# Patient Record
Sex: Male | Born: 1990 | Race: White | Hispanic: No | Marital: Single | Smoking: Never smoker
Health system: Southern US, Community
[De-identification: ages and names within clinical notes are randomized; demographics above are authoritative.]

---

## 2016-12-30 ENCOUNTER — Encounter (HOSPITAL_COMMUNITY): Payer: Self-pay | Admitting: *Deleted

## 2016-12-30 ENCOUNTER — Emergency Department (HOSPITAL_COMMUNITY)
Admission: EM | Admit: 2016-12-30 | Discharge: 2016-12-31 | Discharge status: Against Medical Advice | Payer: BLUE CROSS/BLUE SHIELD | Attending: Family Medicine | Admitting: Family Medicine

## 2016-12-30 DIAGNOSIS — N200 Calculus of kidney: Secondary | ICD-10-CM | POA: Diagnosis not present

## 2016-12-30 DIAGNOSIS — R109 Unspecified abdominal pain: Secondary | ICD-10-CM | POA: Diagnosis present

## 2016-12-30 LAB — URINALYSIS, ROUTINE W REFLEX MICROSCOPIC
Bacteria, UA: NONE SEEN
Bilirubin Urine: NEGATIVE
GLUCOSE, UA: NEGATIVE mg/dL
KETONES UR: NEGATIVE mg/dL
Nitrite: NEGATIVE
PH: 5 (ref 5.0–8.0)
Protein, ur: NEGATIVE mg/dL
SPECIFIC GRAVITY, URINE: 1.014 (ref 1.005–1.030)
Squamous Epithelial / LPF: NONE SEEN

## 2016-12-30 LAB — CBC
HCT: 42.8 % (ref 39.0–52.0)
Hemoglobin: 14.7 g/dL (ref 13.0–17.0)
MCH: 29.3 pg (ref 26.0–34.0)
MCHC: 34.3 g/dL (ref 30.0–36.0)
MCV: 85.4 fL (ref 78.0–100.0)
PLATELETS: 331 10*3/uL (ref 150–400)
RBC: 5.01 MIL/uL (ref 4.22–5.81)
RDW: 12.9 % (ref 11.5–15.5)
WBC: 12.8 10*3/uL — AB (ref 4.0–10.5)

## 2016-12-30 LAB — COMPREHENSIVE METABOLIC PANEL
ALT: 18 U/L (ref 17–63)
AST: 27 U/L (ref 15–41)
Albumin: 4.1 g/dL (ref 3.5–5.0)
Alkaline Phosphatase: 52 U/L (ref 38–126)
Anion gap: 8 (ref 5–15)
BUN: 6 mg/dL (ref 6–20)
CALCIUM: 9.2 mg/dL (ref 8.9–10.3)
CO2: 24 mmol/L (ref 22–32)
Chloride: 106 mmol/L (ref 101–111)
Creatinine, Ser: 1.23 mg/dL (ref 0.61–1.24)
Glucose, Bld: 151 mg/dL — ABNORMAL HIGH (ref 65–99)
Potassium: 3.4 mmol/L — ABNORMAL LOW (ref 3.5–5.1)
SODIUM: 138 mmol/L (ref 135–145)
Total Bilirubin: 1.7 mg/dL — ABNORMAL HIGH (ref 0.3–1.2)
Total Protein: 7.5 g/dL (ref 6.5–8.1)

## 2016-12-30 LAB — LIPASE, BLOOD: LIPASE: 27 U/L (ref 11–51)

## 2016-12-30 NOTE — ED Triage Notes (Signed)
Pt reports acute onset L lower abd pain onset this am that resolved, pt reports pain returning 1-2 hrs ago, pt denies n/v/d, pts mom states, "he does a lot of heavy lifting at home." pt A&O x4, pt tachycardic in triage

## 2016-12-30 NOTE — ED Provider Notes (Signed)
[facility]-EMERGENCY DEPT Provider Note   CSN: [id number] Arrival date & time: 12/30/16  1655     History   Chief Complaint Chief Complaint  Patient presents with  . Abdominal Pain    HPI Bradley Higgins is a 26 y.o. male who presents to the emergency department with a chief complaint of intermittent, sharp left flank pain that began yesterday at 3 AM and woke him from sleep. He reports the pain lasted for a few minutes before resolving spontaneously. He reports another episode of pain that resolved 1-2 hours PTA after eating lunch that also resolved spontaneously. He denies nausea, vomiting, diarrhea, fever, chills. No aggravating or alleviating factors. No treatment prior to arrival. 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 He denies groin pain, testicular pain, or penile pain or discharge. He denies dysuria, frequency, or hematuria. He has no pain at this time.   No pertinent past medical history. No daily medications.  The history is provided by the patient. No language interpreter was used.    History reviewed. No pertinent past medical history.  There are no active problems to display for this patient.   History reviewed. No pertinent surgical history.   Home Medications    Prior to Admission medications   Medication Sig Start Date End Date Taking? Authorizing Provider  oxyCODONE-acetaminophen (PERCOCET/ROXICET) 5-325 MG tablet Take 1 tablet by mouth every 8 (eight) hours as needed for severe pain. 12/31/16   Casyn Becvar A, PA-C  tamsulosin (FLOMAX) 0.4 MG CAPS capsule Take 1 capsule (0.4 mg total) by mouth daily after breakfast. 12/31/16   Shanyla Marconi A, PA-C    Family History No family history on file.  Social History Social History  Substance Use Topics  . Smoking status: Never Smoker  . Smokeless tobacco: Never Used  . Alcohol use Not on file     Allergies   Patient has no known  allergies.   Review of Systems Review of Systems  Constitutional: Negative for activity change, chills and fever.  Respiratory: Negative for shortness of breath.   Cardiovascular: Negative for chest pain.  Gastrointestinal: Negative for abdominal pain, diarrhea, nausea and vomiting.  Genitourinary: Positive for flank pain. Negative for discharge, dysuria, penile pain, scrotal swelling and testicular pain.  Musculoskeletal: Negative for back pain.  Skin: Negative for rash.     Physical Exam Updated Vital Signs BP 106/66   Pulse 89   Temp 98.3 F (36.8 C) (Oral)   Resp 18   SpO2 97%   Physical Exam  Constitutional: He appears well-developed.  HENT:  Head: Normocephalic.  Eyes: Conjunctivae are normal.  Neck: Neck supple.  Cardiovascular: Normal rate, regular rhythm, normal heart sounds and intact distal pulses.   No murmur heard. Pulmonary/Chest: Effort normal and breath sounds normal. No respiratory distress. He has no wheezes. He has no rales.  Abdominal: Soft. He exhibits no distension and no mass. There is no tenderness. There is no rebound and no guarding.  No CVA tenderness bilaterally. No pain at this time.   Musculoskeletal:  No flank pain bilaterally. No tenderness to palpation of the cervical, thoracic, or lumbar spinous processes or surrounding paraspinal muscles.  Neurological: He is alert.  Skin: Skin is warm and dry.  Psychiatric: His behavior is normal.  Nursing note and vitals reviewed.  ED Treatments / Results  Labs (all labs ordered are listed, but only abnormal results are displayed) Labs  Reviewed  COMPREHENSIVE METABOLIC PANEL - Abnormal; Notable for the following:       Result Value   Potassium 3.4 (*)    Glucose, Bld 151 (*)    Total Bilirubin 1.7 (*)    All other components within normal limits  CBC - Abnormal; Notable for the following:    WBC 12.8 (*)    All other components within normal limits  URINALYSIS, ROUTINE W REFLEX MICROSCOPIC -  Abnormal; Notable for the following:    Hgb urine dipstick LARGE (*)    Leukocytes, UA TRACE (*)    All other components within normal limits  LIPASE, BLOOD    EKG  EKG Interpretation None       Radiology Ct Renal Stone Study  Result Date: 12/31/2016 CLINICAL DATA:  Sudden onset of left flank pain, onset at 04:00 yesterday. EXAM: CT ABDOMEN AND PELVIS WITHOUT CONTRAST TECHNIQUE: Multidetector CT imaging of the abdomen and pelvis was performed following the standard protocol without IV contrast. COMPARISON:  None. FINDINGS: Lower chest: No acute abnormality. Hepatobiliary: No focal liver abnormality is seen. No gallstones, gallbladder wall thickening, or biliary dilatation. Pancreas: Unremarkable. No pancreatic ductal dilatation or surrounding inflammatory changes. Spleen: Normal in size without focal abnormality. Adrenals/Urinary Tract: Both adrenals are normal. Right kidney and ureter are normal. Moderate left hydronephrosis due to a 2 mm calculus of the proximal left ureter at the L2-3 level. Urinary bladder is unremarkable. Stomach/Bowel: Stomach is within normal limits. Appendix is normal. No evidence of bowel wall thickening, distention, or inflammatory changes. Vascular/Lymphatic: No significant vascular findings are present. No enlarged abdominal or pelvic lymph nodes. Reproductive: Unremarkable Other: Small fat containing umbilical hernia. Musculoskeletal: No significant skeletal lesion. IMPRESSION: 2 mm left ureteral calculus at the L2-3 level with moderate hydronephrosis. Electronically Signed   By: Ellery Plunkaniel R Mitchell M.D.   On: 12/31/2016 00:43    Procedures Procedures (including critical care time)  Medications Ordered in ED Medications - No data to display   Initial Impression / Assessment and Plan / ED Course  I have reviewed the triage vital signs and the nursing notes.  Pertinent labs & imaging results that were available during my care of the patient were reviewed by me  and considered in my medical decision making (see chart for details).     Pt has been diagnosed with a Kidney Stone via CT. There is no evidence of significant hydronephrosis, serum creatine WNL, and the pt does not have irratractable vomiting. Patient was initially tachycardic, but improved throughout the visit. Pt will be dc home with Tamulosin, pain medication, & has been advised to follow up with PCP.   Final Clinical Impressions(s) / ED Diagnoses   Final diagnoses:  Kidney stone    New Prescriptions New Prescriptions   OXYCODONE-ACETAMINOPHEN (PERCOCET/ROXICET) 5-325 MG TABLET    Take 1 tablet by mouth every 8 (eight) hours as needed for severe pain.   TAMSULOSIN (FLOMAX) 0.4 MG CAPS CAPSULE    Take 1 capsule (0.4 mg total) by mouth daily after breakfast.     Lilian KapurMcDonald, Bryley Chrisman A, PA-C 12/31/16 0126    Mancel BaleWentz, Elliott, MD 01/09/17 2244

## 2016-12-31 ENCOUNTER — Emergency Department (HOSPITAL_COMMUNITY): Payer: BLUE CROSS/BLUE SHIELD

## 2016-12-31 MED ORDER — TAMSULOSIN HCL 0.4 MG PO CAPS: 0.4000 mg | ORAL_CAPSULE | Freq: Every day | ORAL | 0 refills | Status: AC

## 2016-12-31 MED ORDER — OXYCODONE-ACETAMINOPHEN 5-325 MG PO TABS: 1.0000 | ORAL_TABLET | Freq: Three times a day (TID) | ORAL | 0 refills | Status: AC | PRN

## 2016-12-31 NOTE — ED Notes (Signed)
Pt to CT via stretcher

## 2016-12-31 NOTE — Discharge Instructions (Signed)
Your were diagnosed with a kidney stone today. Please take tamsulosin once daily until your stone passes. You can call to follow up with urology when they reopen on Tuesday if your stone has not passed by then. You may take 1 tablet of Percocet every 8 hours as needed for severe pain. Please do not drive or work after taking this medication because it can make you sleepy.  It is also a narcotic medication and can be addicting so please only take when your pain is severe.   If you develop new or worsening symptoms, including pain that does not improve with pain medication, fever, chills, or vomiting, please return to the emergency department for re-evaluation.

## 2018-05-22 IMAGING — CT CT RENAL STONE PROTOCOL
2 of 4 series · 17 of 46 positions shown, 19 images · non-contrast
Comparison: None.

CLINICAL DATA: Sudden onset of left flank pain, onset at [DATE]
yesterday.

EXAM:
CT ABDOMEN AND PELVIS WITHOUT CONTRAST
TECHNIQUE: Multidetector CT imaging of the abdomen and pelvis was performed
following the standard protocol without IV contrast.

[Series 3: renal stone 5.0 · axial · 0.84mm/px · z∈[-400,+55]mm · 14 of 101 slices shown, 16 images]
[im 5/101  soft-tissue]
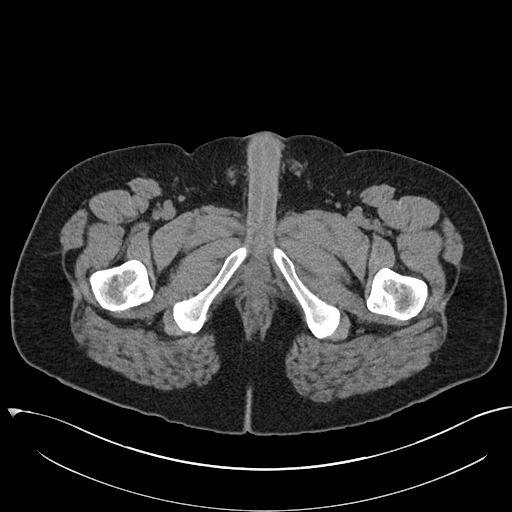
[im 5/101  bone]
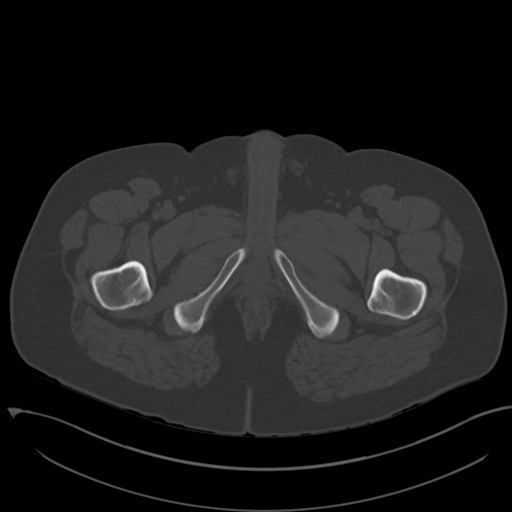
[im 14/101  soft-tissue]
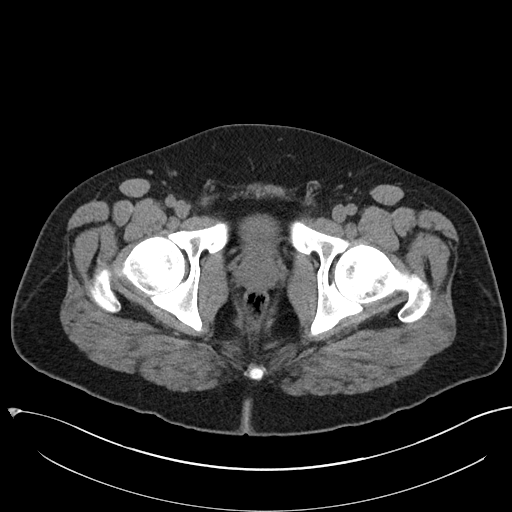
[im 18/101  soft-tissue]
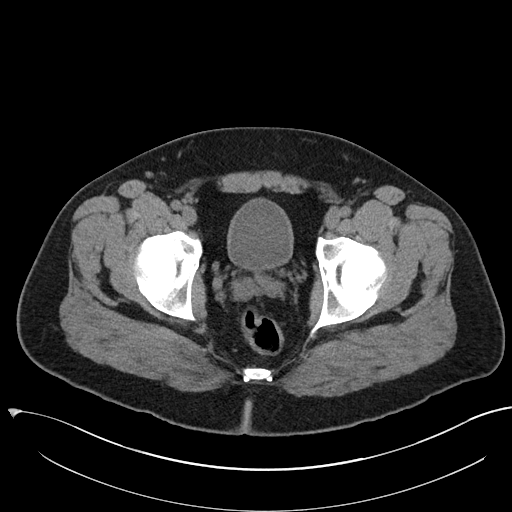
[im 27/101  soft-tissue]
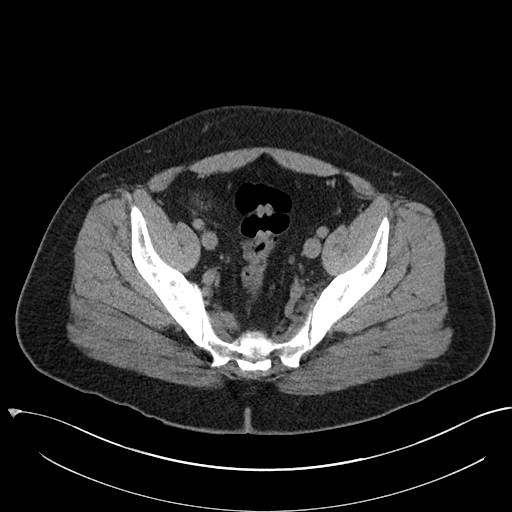
[im 35/101  soft-tissue]
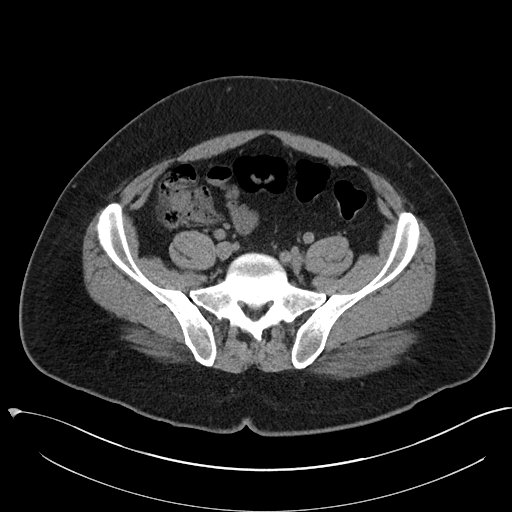
[im 40/101  soft-tissue]
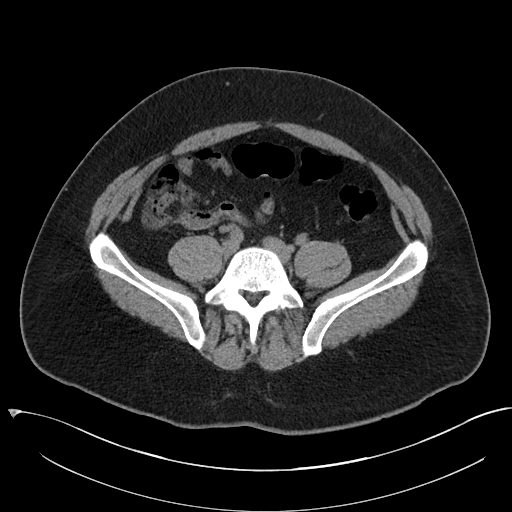
[im 48/101  soft-tissue]
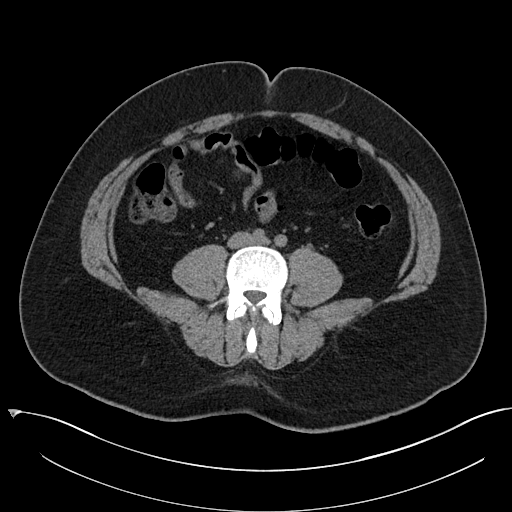
[im 53/101  soft-tissue]
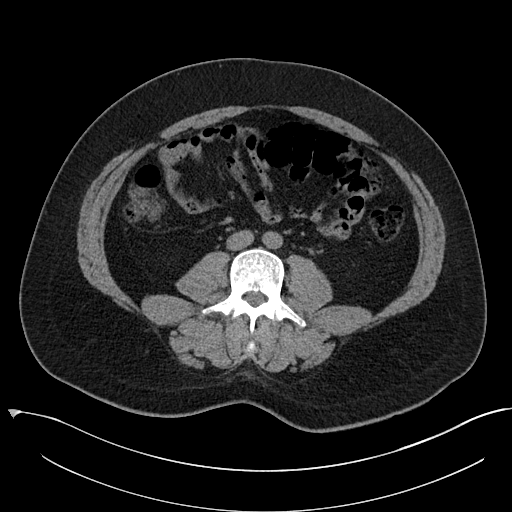
[im 61/101  soft-tissue]
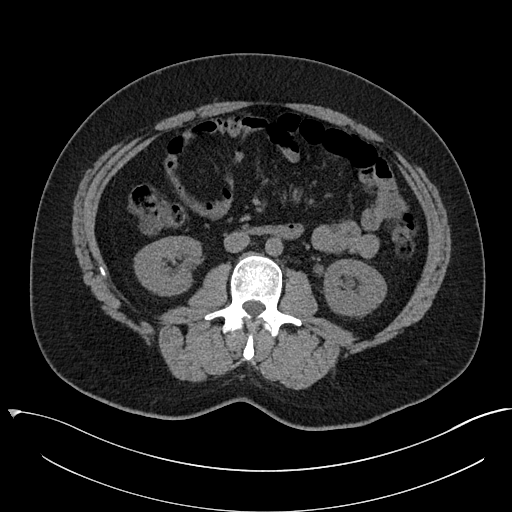
[im 61/101  bone]
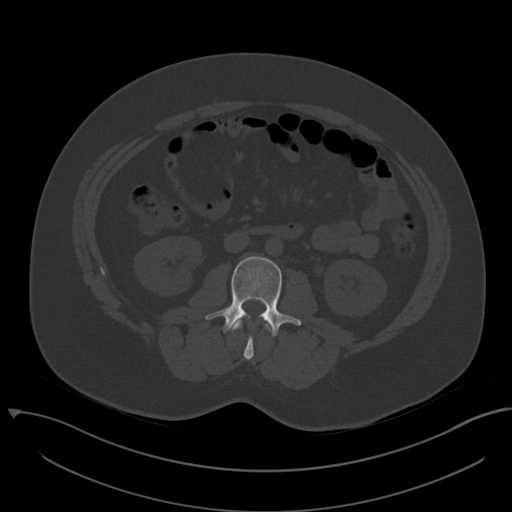
[im 66/101  soft-tissue]
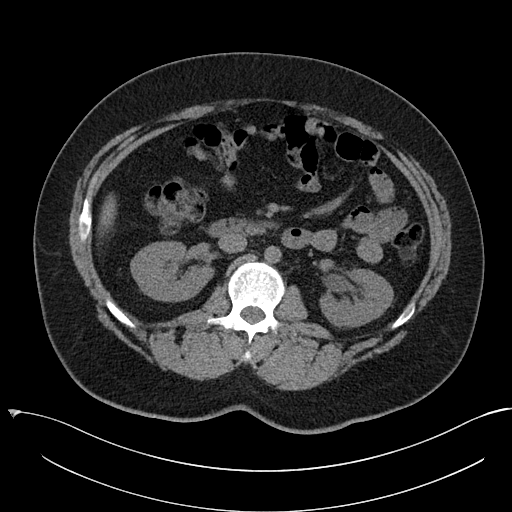
[im 74/101  soft-tissue]
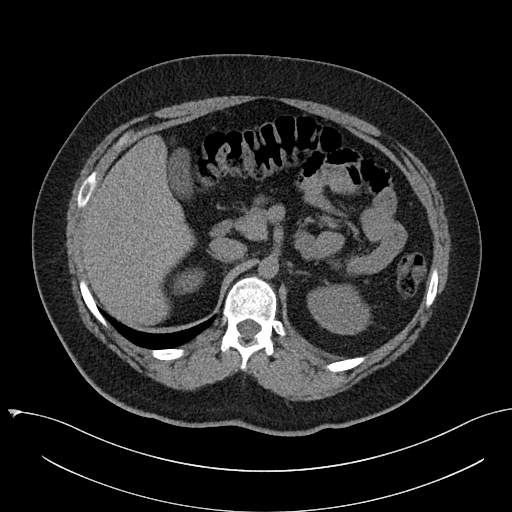
[im 83/101  soft-tissue]
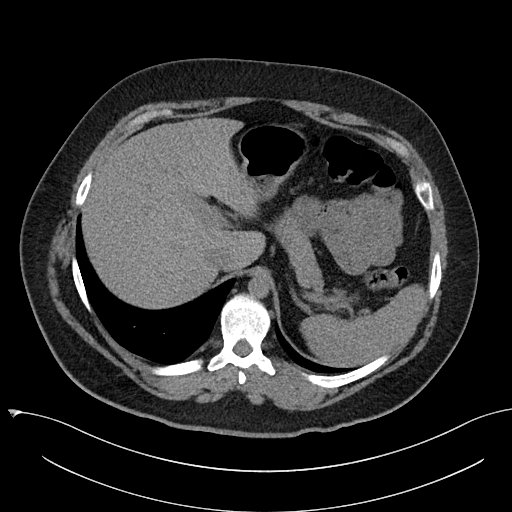
[im 87/101  soft-tissue]
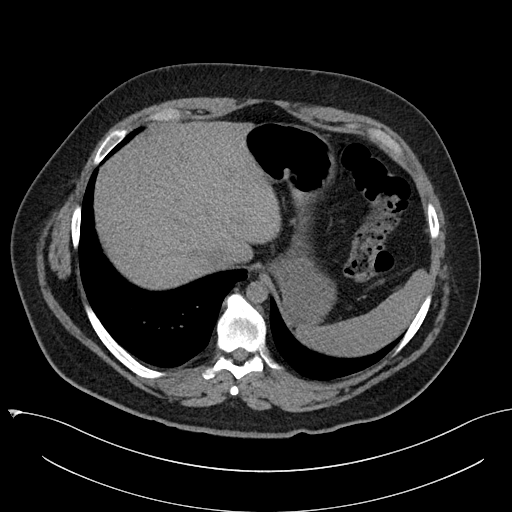
[im 96/101  soft-tissue]
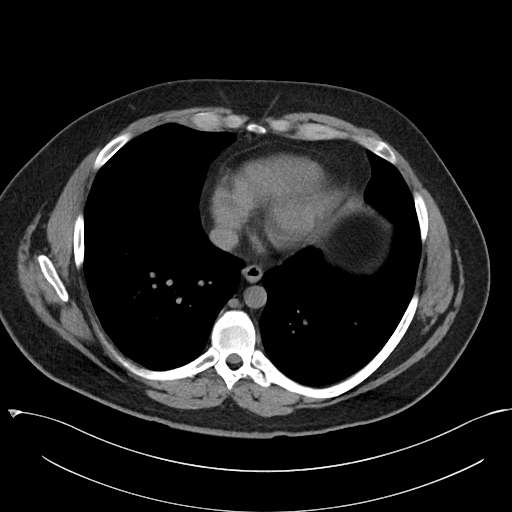

[Series 6: renal stone 3.0 cor · coronal · 0.83mm/px · 3 of 101 slices shown]
[im 34/101  soft-tissue]
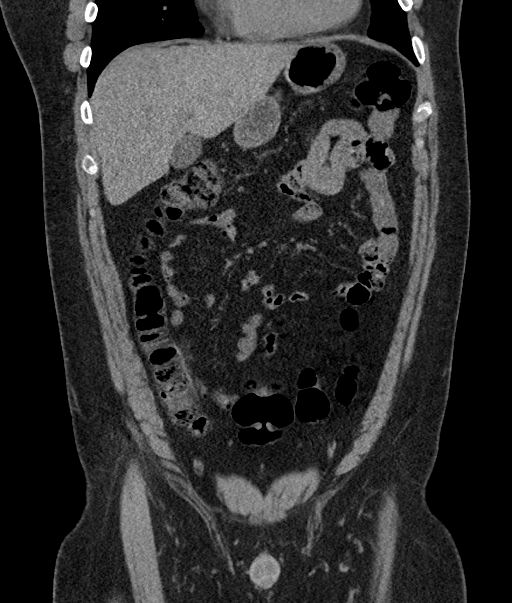
[im 45/101  soft-tissue]
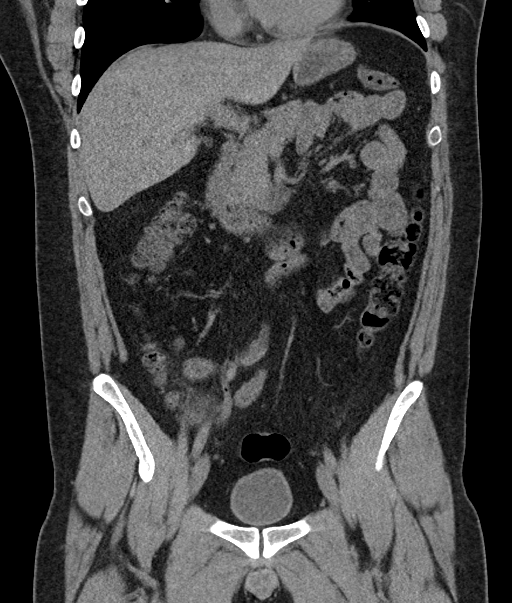
[im 56/101  soft-tissue]
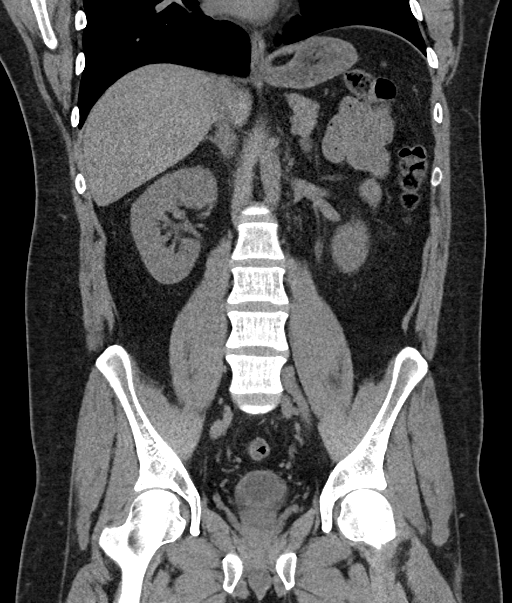

[17 of 46 positions shown; findings below may reference images not displayed]

FINDINGS: Lower chest: No acute abnormality.

Hepatobiliary: No focal liver abnormality is seen. No gallstones,
gallbladder wall thickening, or biliary dilatation.

Pancreas: Unremarkable. No pancreatic ductal dilatation or
surrounding inflammatory changes.

Spleen: Normal in size without focal abnormality.

Adrenals/Urinary Tract: Both adrenals are normal. Right kidney and
ureter are normal. Moderate left hydronephrosis due to a 2 mm
calculus of the proximal left ureter at the L2-3 level.

Urinary bladder is unremarkable.

Stomach/Bowel: Stomach is within normal limits. Appendix is normal.
No evidence of bowel wall thickening, distention, or inflammatory
changes.

Vascular/Lymphatic: No significant vascular findings are present. No
enlarged abdominal or pelvic lymph nodes.

Reproductive: Unremarkable

Other: Small fat containing umbilical hernia.

Musculoskeletal: No significant skeletal lesion.
IMPRESSION: 2 mm left ureteral calculus at the L2-3 level with moderate
hydronephrosis.
